# Patient Record
Sex: Male | Born: 1975
Health system: Southern US, Community
[De-identification: ages and names within clinical notes are randomized; demographics above are authoritative.]

---

## 2017-10-26 DIAGNOSIS — H538 Other visual disturbances: Secondary | ICD-10-CM | POA: Diagnosis not present

## 2017-10-26 DIAGNOSIS — R51 Headache: Secondary | ICD-10-CM | POA: Diagnosis not present

## 2018-01-22 ENCOUNTER — Encounter (HOSPITAL_BASED_OUTPATIENT_CLINIC_OR_DEPARTMENT_OTHER): Payer: Self-pay | Admitting: Emergency Medicine

## 2018-01-22 ENCOUNTER — Emergency Department (HOSPITAL_BASED_OUTPATIENT_CLINIC_OR_DEPARTMENT_OTHER): Payer: Commercial Managed Care - PPO

## 2018-01-22 ENCOUNTER — Other Ambulatory Visit: Payer: Self-pay

## 2018-01-22 ENCOUNTER — Emergency Department (HOSPITAL_BASED_OUTPATIENT_CLINIC_OR_DEPARTMENT_OTHER)
Admission: EM | Admit: 2018-01-22 | Discharge: 2018-01-22 | Disposition: A | Discharge status: Home / Self Care | Payer: Commercial Managed Care - PPO | Attending: Emergency Medicine | Admitting: Emergency Medicine

## 2018-01-22 DIAGNOSIS — R3129 Other microscopic hematuria: Secondary | ICD-10-CM | POA: Diagnosis not present

## 2018-01-22 DIAGNOSIS — N132 Hydronephrosis with renal and ureteral calculous obstruction: Secondary | ICD-10-CM | POA: Diagnosis not present

## 2018-01-22 DIAGNOSIS — N200 Calculus of kidney: Secondary | ICD-10-CM | POA: Insufficient documentation

## 2018-01-22 DIAGNOSIS — R319 Hematuria, unspecified: Secondary | ICD-10-CM | POA: Diagnosis not present

## 2018-01-22 DIAGNOSIS — R1031 Right lower quadrant pain: Secondary | ICD-10-CM | POA: Diagnosis not present

## 2018-01-22 DIAGNOSIS — N50811 Right testicular pain: Secondary | ICD-10-CM | POA: Diagnosis not present

## 2018-01-22 LAB — CBC WITH DIFFERENTIAL/PLATELET
Basophils Absolute: 0 10*3/uL (ref 0.0–0.1)
Basophils Relative: 0 %
EOS ABS: 0.3 10*3/uL (ref 0.0–0.7)
EOS PCT: 6 %
HCT: 41.8 % (ref 39.0–52.0)
Hemoglobin: 14.4 g/dL (ref 13.0–17.0)
LYMPHS ABS: 2.3 10*3/uL (ref 0.7–4.0)
Lymphocytes Relative: 41 %
MCH: 31.6 pg (ref 26.0–34.0)
MCHC: 34.4 g/dL (ref 30.0–36.0)
MCV: 91.9 fL (ref 78.0–100.0)
MONO ABS: 0.4 10*3/uL (ref 0.1–1.0)
MONOS PCT: 7 %
Neutro Abs: 2.6 10*3/uL (ref 1.7–7.7)
Neutrophils Relative %: 46 %
PLATELETS: 180 10*3/uL (ref 150–400)
RBC: 4.55 MIL/uL (ref 4.22–5.81)
RDW: 12.5 % (ref 11.5–15.5)
WBC: 5.7 10*3/uL (ref 4.0–10.5)

## 2018-01-22 LAB — URINALYSIS, ROUTINE W REFLEX MICROSCOPIC
BILIRUBIN URINE: NEGATIVE
Glucose, UA: NEGATIVE mg/dL
KETONES UR: NEGATIVE mg/dL
Leukocytes, UA: NEGATIVE
NITRITE: NEGATIVE
Protein, ur: NEGATIVE mg/dL
SPECIFIC GRAVITY, URINE: 1.02 (ref 1.005–1.030)
pH: 6.5 (ref 5.0–8.0)

## 2018-01-22 LAB — BASIC METABOLIC PANEL
Anion gap: 6 (ref 5–15)
BUN: 16 mg/dL (ref 6–20)
CO2: 27 mmol/L (ref 22–32)
CREATININE: 0.83 mg/dL (ref 0.61–1.24)
Calcium: 8.9 mg/dL (ref 8.9–10.3)
Chloride: 107 mmol/L (ref 101–111)
GFR calc Af Amer: >60 mL/min (ref >60)
Glucose, Bld: 95 mg/dL (ref 65–99)
Potassium: 3.6 mmol/L (ref 3.5–5.1)
SODIUM: 140 mmol/L (ref 135–145)

## 2018-01-22 LAB — URINALYSIS, MICROSCOPIC (REFLEX)
Squamous Epithelial / LPF: NONE SEEN
WBC, UA: NONE SEEN WBC/hpf (ref 0–5)

## 2018-01-22 MED ORDER — TAMSULOSIN HCL 0.4 MG PO CAPS: 0.4000 mg | ORAL_CAPSULE | Freq: Every day | ORAL | 0 refills | Status: AC

## 2018-01-22 MED ORDER — HYDROCODONE-ACETAMINOPHEN 5-325 MG PO TABS: 1.0000 | ORAL_TABLET | Freq: Four times a day (QID) | ORAL | 0 refills | Status: AC | PRN

## 2018-01-22 NOTE — ED Triage Notes (Signed)
Pt reports RLQ abd pain radiating into testicle since last night. Denies N/V. Sent from UC due to blood noted in urine.

## 2018-01-22 NOTE — ED Provider Notes (Signed)
[facility] EMERGENCY DEPARTMENT Provider Note   CSN: [id number] Arrival date & time: 01/22/18  1658     History   Chief Complaint Chief Complaint  Patient presents with  . Abdominal Pain    HPI Frederick Kelley is a 42 y.o. male.  The history is provided by the patient and the spouse.  Abdominal Pain   This is a new problem. The current episode started yesterday. The problem occurs constantly. The problem has been gradually improving. The pain is associated with an unknown factor. The pain is located in the RLQ (right flank radiating down to the right testicle). The quality of the pain is shooting, throbbing, sharp and colicky. The pain is at a severity of 9/10. The pain is mild (now pain is only mild but severe earlier). Associated symptoms include hematuria. Pertinent negatives include anorexia, fever, nausea, vomiting, constipation, dysuria, frequency and myalgias. Nothing aggravates the symptoms. Nothing relieves the symptoms. Past medical history comments: seen at walk in clinic and had blood in urine.    History reviewed. No pertinent past medical history.  There are no active problems to display for this patient.   History reviewed. No pertinent surgical history.     Home Medications    Prior to Admission medications   Not on File    Family History No family history on file.  Social History Social History   Tobacco Use  . Smoking status: Never Smoker  . Smokeless tobacco: Never Used  Substance Use Topics  . Alcohol use: No    Frequency: Never  . Drug use: No     Allergies   Patient has no known allergies.   Review of Systems Review of Systems  Constitutional: Negative for fever.  Gastrointestinal: Positive for abdominal pain. Negative for anorexia, constipation, nausea and vomiting.  Genitourinary: Positive for hematuria. Negative for dysuria and frequency.  Musculoskeletal: Negative for myalgias.  All other systems reviewed and  are negative.    Physical Exam Updated Vital Signs BP 115/75 (BP Location: Left Arm)   Pulse (!) 56   Temp 98.2 F (36.8 C) (Oral)   Resp 18   Ht 5\' 10"  (1.778 m)   Wt 64.4 kg (142 lb)   SpO2 100%   BMI 20.37 kg/m   Physical Exam  Constitutional: He is oriented to person, place, and time. He appears well-developed and well-nourished. No distress.  HENT:  Head: Normocephalic and atraumatic.  Mouth/Throat: Oropharynx is clear and moist.  Eyes: Conjunctivae and EOM are normal. Pupils are equal, round, and reactive to light.  Neck: Normal range of motion. Neck supple.  Cardiovascular: Normal rate, regular rhythm and intact distal pulses.  No murmur heard. Pulmonary/Chest: Effort normal and breath sounds normal. No respiratory distress. He has no wheezes. He has no rales.  Abdominal: Soft. He exhibits no distension. There is no tenderness. There is CVA tenderness. There is no rebound and no guarding. No hernia.  Mild right CVA tenderness  Musculoskeletal: Normal range of motion. He exhibits no edema or tenderness.  Neurological: He is alert and oriented to person, place, and time.  Skin: Skin is warm and dry. No rash noted. No erythema.  Psychiatric: He has a normal mood and affect. His behavior is normal.  Nursing note and vitals reviewed.    ED Treatments / Results  Labs (all labs ordered are listed, but only abnormal results are displayed) Labs Reviewed  URINALYSIS, ROUTINE W REFLEX MICROSCOPIC - Abnormal; Notable for the following components:  Result Value   Hgb urine dipstick LARGE (*)    All other components within normal limits  URINALYSIS, MICROSCOPIC (REFLEX) - Abnormal; Notable for the following components:   Bacteria, UA RARE (*)    All other components within normal limits  CBC WITH DIFFERENTIAL/PLATELET  BASIC METABOLIC PANEL    EKG  EKG Interpretation None       Radiology Ct Renal Stone Study  Result Date: 01/22/2018 CLINICAL DATA:  Pain,  hematuria EXAM: CT ABDOMEN AND PELVIS WITHOUT CONTRAST TECHNIQUE: Multidetector CT imaging of the abdomen and pelvis was performed following the standard protocol without IV contrast. COMPARISON:  None. FINDINGS: Lower chest: No acute abnormality. Hepatobiliary: No focal liver abnormality is seen. No gallstones, gallbladder wall thickening, or biliary dilatation. Pancreas: Unremarkable. No pancreatic ductal dilatation or surrounding inflammatory changes. Spleen: Normal in size without focal abnormality. Adrenals/Urinary Tract: 6 x 3 mm stone within the mid to distal right ureter causing moderate right-sided hydronephrosis. Left kidney appears normal without mass, stone or hydronephrosis. Adrenal glands appear normal. Bladder is unremarkable. Stomach/Bowel: Bowel is normal in caliber. No bowel wall thickening or evidence of bowel wall inflammation. Appendix is normal. Vascular/Lymphatic: No significant vascular findings are present. No enlarged abdominal or pelvic lymph nodes. Reproductive: Uterus and bilateral adnexa are unremarkable. Other: No free fluid or abscess collection. No free intraperitoneal air. Musculoskeletal: No acute or significant osseous findings. IMPRESSION: 6 x 3 mm stone within the mid-to-distal right ureter, at the level of the mid sacrum, causing moderate right-sided hydronephrosis. Electronically Signed   By: Bary RichardStan  Maynard M.D.   On: 01/22/2018 20:36    Procedures Procedures (including critical care time)  Medications Ordered in ED Medications - No data to display   Initial Impression / Assessment and Plan / ED Course  I have reviewed the triage vital signs and the nursing notes.  Pertinent labs & imaging results that were available during my care of the patient were reviewed by me and considered in my medical decision making (see chart for details).     Pt with symptoms consistent with kidney stone.  Denies infectious sx, or GI symptoms.  Low concern for diverticulitis and no  risk factors or history suggestive of AAA.  No hx suggestive of GU source (discharge) and otherwise pt is healthy. Pt's pain is only mild currently and UA showed hematuria without infection, CBC, BMP and will get stone study to further eval.  8:39 PM 6x443mm stone in the distal mid ureter with hydronephrosis.  Labs reassuring. Will give pain meds for home and flomax.  Will give urology f/u.  Final Clinical Impressions(s) / ED Diagnoses   Final diagnoses:  Kidney stone on right side    ED Discharge Orders        Ordered    tamsulosin (FLOMAX) 0.4 MG CAPS capsule  Daily after supper     01/22/18 2041    HYDROcodone-acetaminophen (NORCO/VICODIN) 5-325 MG tablet  Every 6 hours PRN     01/22/18 2041       Gwyneth SproutPlunkett, Kuuipo Anzaldo, MD 01/22/18 2041

## 2018-01-22 NOTE — ED Notes (Signed)
Pt teaching provided on medications that may cause drowsiness. Pt instructed not to drive or operate heavy machinery while taking the prescribed medication. Pt verbalized understanding.   

## 2018-01-26 DIAGNOSIS — N200 Calculus of kidney: Secondary | ICD-10-CM | POA: Diagnosis not present

## 2018-02-09 DIAGNOSIS — N200 Calculus of kidney: Secondary | ICD-10-CM | POA: Diagnosis not present

## 2018-09-27 DIAGNOSIS — Z23 Encounter for immunization: Secondary | ICD-10-CM | POA: Diagnosis not present

## 2018-12-07 IMAGING — CT CT RENAL STONE PROTOCOL
2 of 4 series · 16 of 46 positions shown, 18 images · non-contrast
Comparison: None.

CLINICAL DATA: Pain, hematuria

EXAM:
CT ABDOMEN AND PELVIS WITHOUT CONTRAST
TECHNIQUE: Multidetector CT imaging of the abdomen and pelvis was performed
following the standard protocol without IV contrast.

[Series 2: axial st · axial · 0.69mm/px · z∈[-456,-46]mm · 13 of 90 slices shown, 15 images]
[im 4/90  soft-tissue]
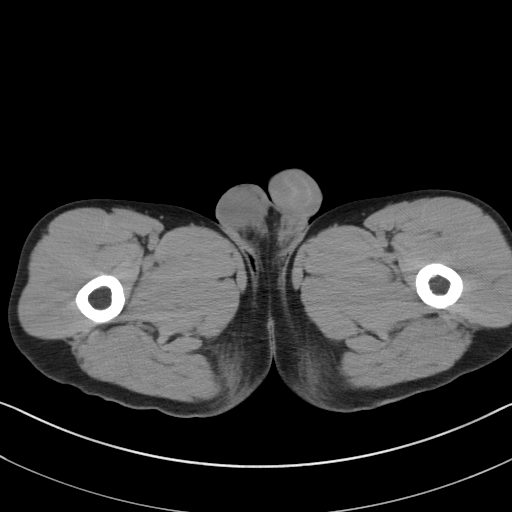
[im 4/90  bone]
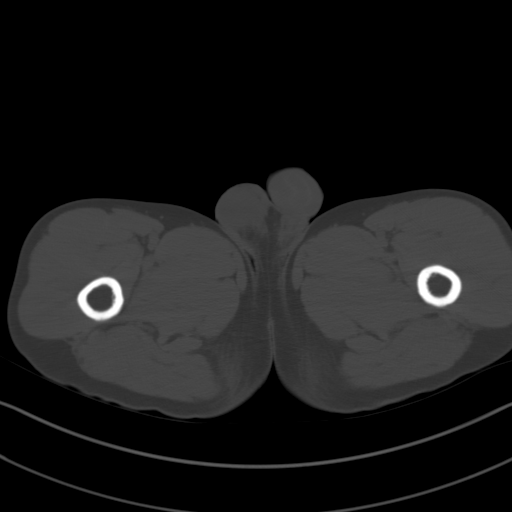
[im 11/90  soft-tissue]
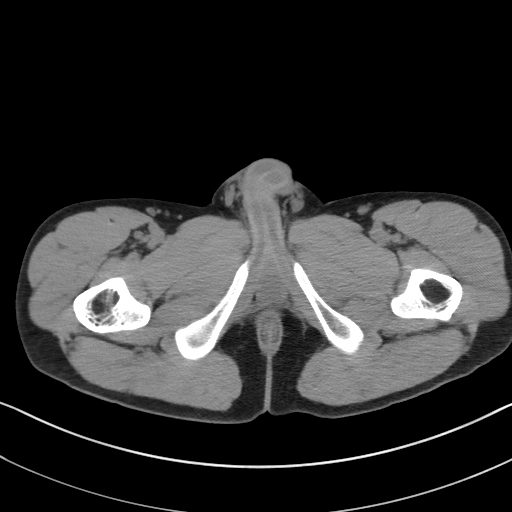
[im 18/90  soft-tissue]
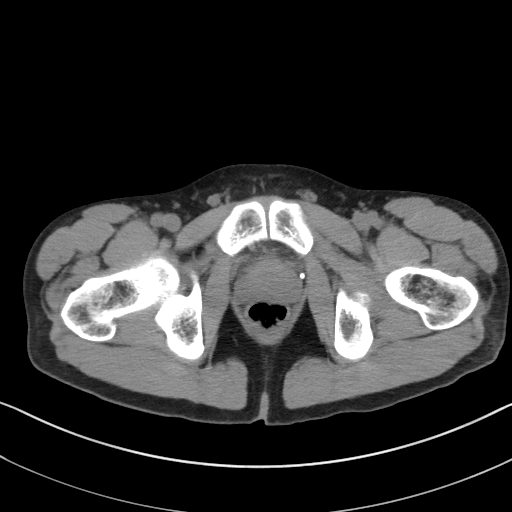
[im 24/90  soft-tissue]
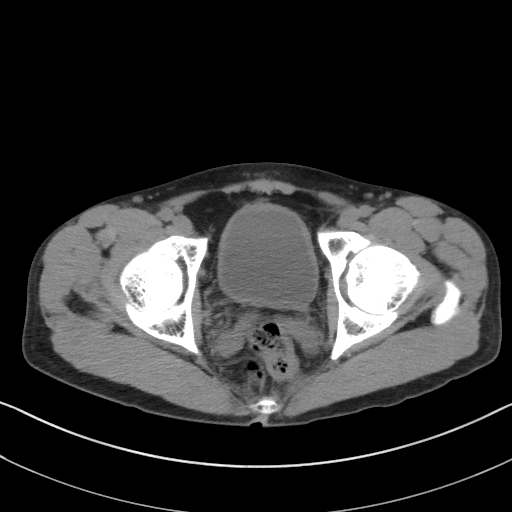
[im 31/90  soft-tissue]
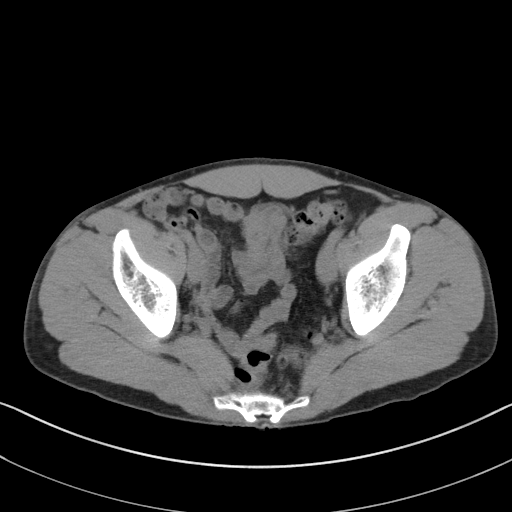
[im 38/90  soft-tissue]
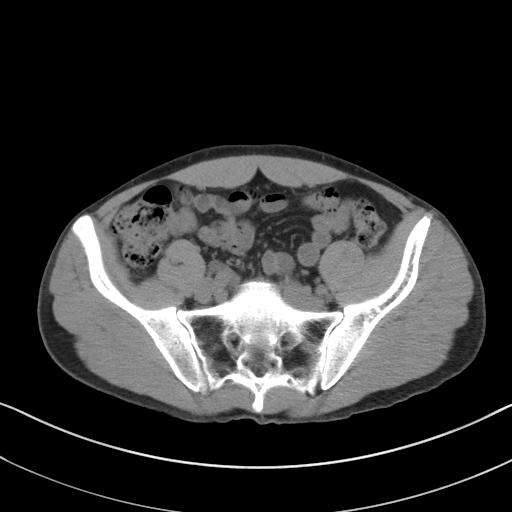
[im 45/90  soft-tissue]
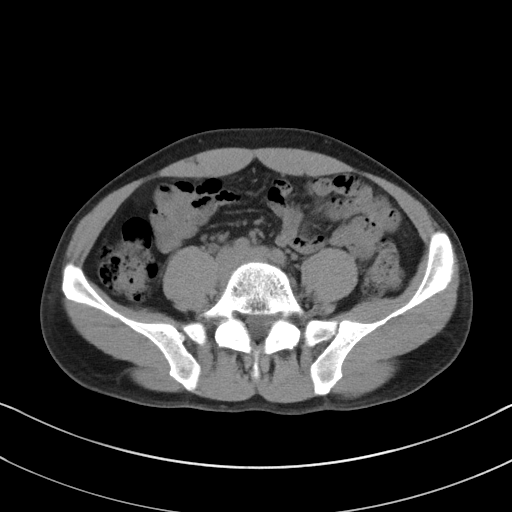
[im 52/90  soft-tissue]
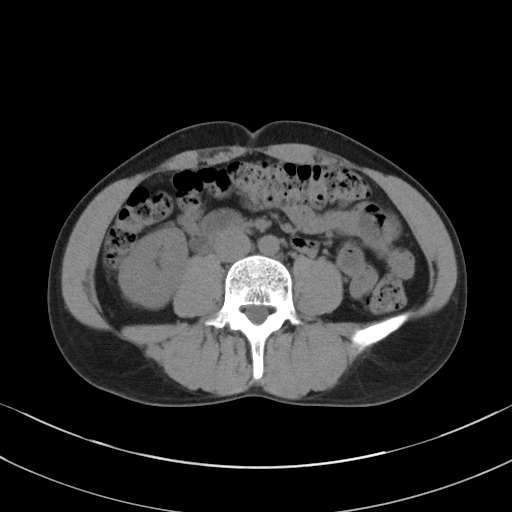
[im 59/90  soft-tissue]
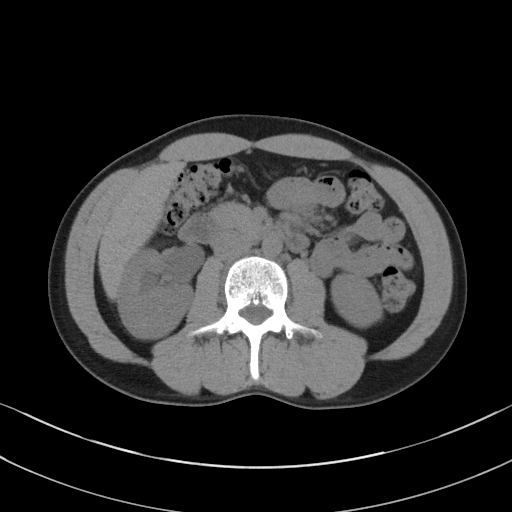
[im 59/90  bone]
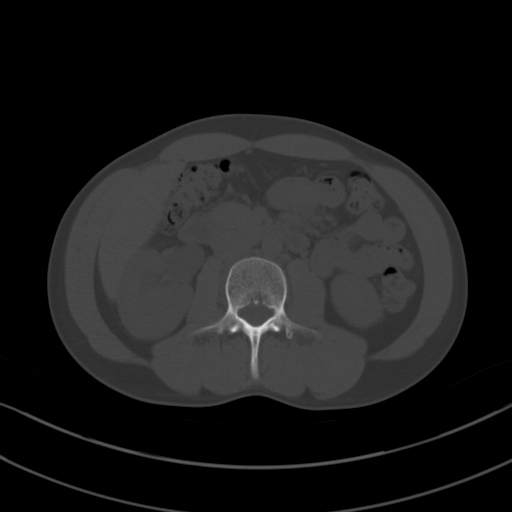
[im 66/90  soft-tissue]
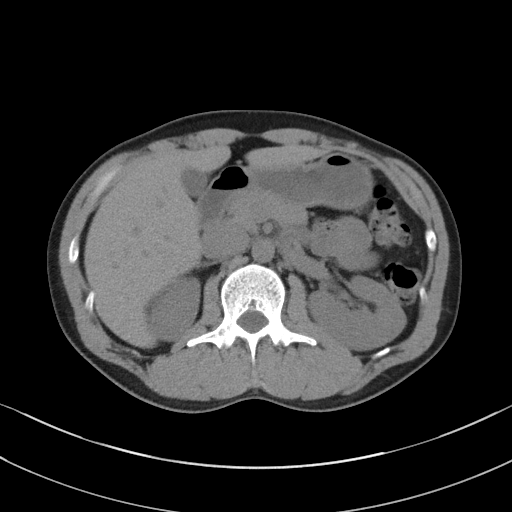
[im 72/90  soft-tissue]
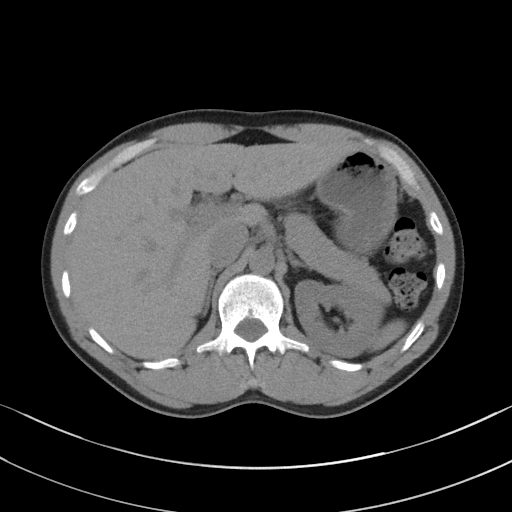
[im 79/90  soft-tissue]
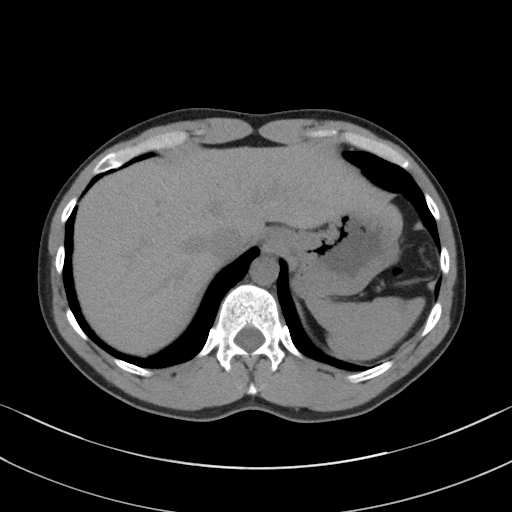
[im 86/90  soft-tissue]
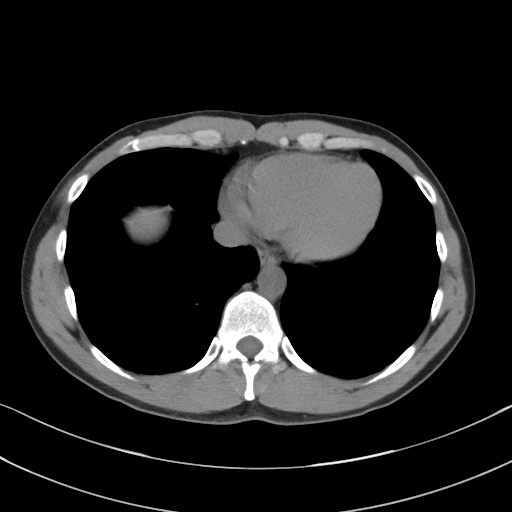

[Series 5: coronal st · coronal · 0.67mm/px · 3 of 76 slices shown]
[im 26/76  soft-tissue]
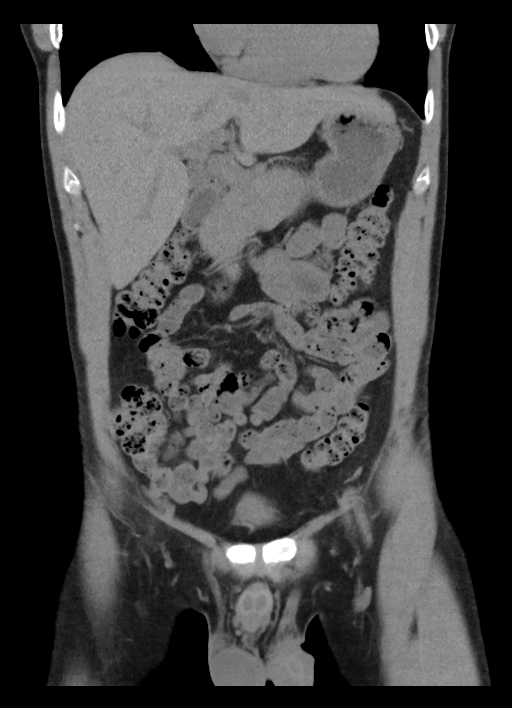
[im 34/76  soft-tissue]
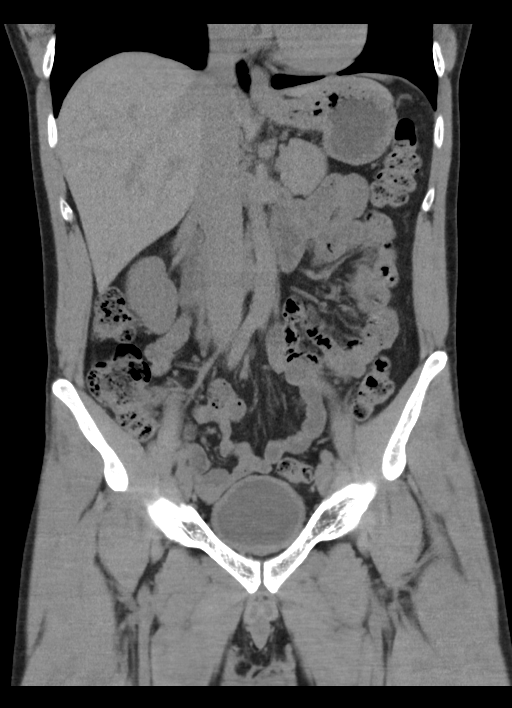
[im 42/76  soft-tissue]
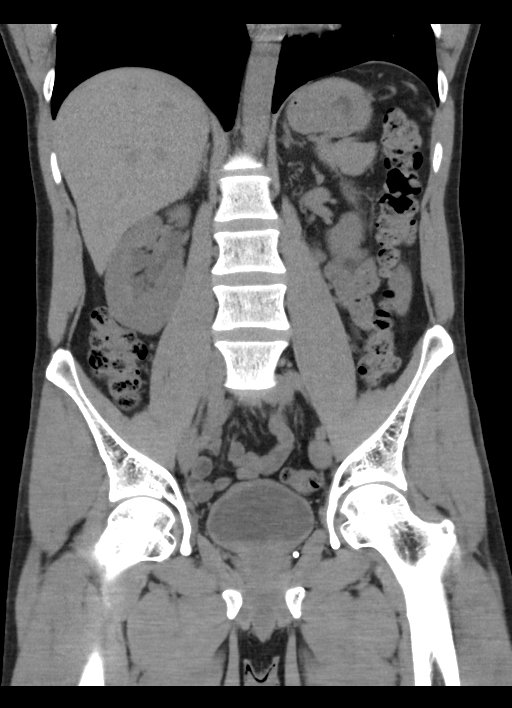

[16 of 46 positions shown; findings below may reference images not displayed]

FINDINGS: Lower chest: No acute abnormality.

Hepatobiliary: No focal liver abnormality is seen. No gallstones,
gallbladder wall thickening, or biliary dilatation.

Pancreas: Unremarkable. No pancreatic ductal dilatation or
surrounding inflammatory changes.

Spleen: Normal in size without focal abnormality.

Adrenals/Urinary Tract: 6 x 3 mm stone within the mid to distal
right ureter causing moderate right-sided hydronephrosis.

Left kidney appears normal without mass, stone or hydronephrosis.
Adrenal glands appear normal. Bladder is unremarkable.

Stomach/Bowel: Bowel is normal in caliber. No bowel wall thickening
or evidence of bowel wall inflammation. Appendix is normal.

Vascular/Lymphatic: No significant vascular findings are present. No
enlarged abdominal or pelvic lymph nodes.

Reproductive: Uterus and bilateral adnexa are unremarkable.

Other: No free fluid or abscess collection. No free intraperitoneal
air.

Musculoskeletal: No acute or significant osseous findings.
IMPRESSION: 6 x 3 mm stone within the mid-to-distal right ureter, at the level
of the mid sacrum, causing moderate right-sided hydronephrosis.
